# Patient Record
Sex: Male | Born: 1980 | Race: White | Hispanic: No | Marital: Single | Smoking: Current every day smoker
Health system: Southern US, Community
[De-identification: ages and names within clinical notes are randomized; demographics above are authoritative.]

---

## 2015-07-20 ENCOUNTER — Emergency Department (HOSPITAL_COMMUNITY): Payer: Self-pay

## 2015-07-20 ENCOUNTER — Encounter (HOSPITAL_COMMUNITY): Payer: Self-pay | Admitting: Emergency Medicine

## 2015-07-20 ENCOUNTER — Emergency Department (HOSPITAL_COMMUNITY)
Admission: EM | Admit: 2015-07-20 | Discharge: 2015-07-20 | Disposition: A | Discharge status: Home / Self Care | Payer: Self-pay | Attending: Emergency Medicine | Admitting: Emergency Medicine

## 2015-07-20 DIAGNOSIS — Z720 Tobacco use: Secondary | ICD-10-CM | POA: Insufficient documentation

## 2015-07-20 DIAGNOSIS — Y998 Other external cause status: Secondary | ICD-10-CM | POA: Insufficient documentation

## 2015-07-20 DIAGNOSIS — Y92410 Unspecified street and highway as the place of occurrence of the external cause: Secondary | ICD-10-CM | POA: Insufficient documentation

## 2015-07-20 DIAGNOSIS — S199XXA Unspecified injury of neck, initial encounter: Secondary | ICD-10-CM | POA: Insufficient documentation

## 2015-07-20 DIAGNOSIS — Y9355 Activity, bike riding: Secondary | ICD-10-CM | POA: Insufficient documentation

## 2015-07-20 DIAGNOSIS — S6991XA Unspecified injury of right wrist, hand and finger(s), initial encounter: Secondary | ICD-10-CM | POA: Insufficient documentation

## 2015-07-20 DIAGNOSIS — S4991XA Unspecified injury of right shoulder and upper arm, initial encounter: Secondary | ICD-10-CM | POA: Insufficient documentation

## 2015-07-20 DIAGNOSIS — Y9389 Activity, other specified: Secondary | ICD-10-CM | POA: Insufficient documentation

## 2015-07-20 DIAGNOSIS — S59901A Unspecified injury of right elbow, initial encounter: Secondary | ICD-10-CM | POA: Insufficient documentation

## 2015-07-20 MED ORDER — IBUPROFEN 600 MG PO TABS: 600.0000 mg | ORAL_TABLET | Freq: Four times a day (QID) | ORAL | Status: DC | PRN

## 2015-07-20 MED ORDER — OXYCODONE-ACETAMINOPHEN 5-325 MG PO TABS
1.0000 | ORAL_TABLET | Freq: Once | ORAL | Status: AC
  Administered 2015-07-20: 1 via ORAL
  Filled 2015-07-20: qty 1

## 2015-07-20 MED ORDER — CYCLOBENZAPRINE HCL 10 MG PO TABS: 5.0000 mg | ORAL_TABLET | Freq: Two times a day (BID) | ORAL | Status: AC | PRN
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
## 2015-07-20 NOTE — Discharge Instructions (Signed)
Bicycling, Adult Cyclists  Whether motivated by recreation or transportation, more adults are taking up cycling than ever before. Learning more about cycling greatly increases confidence. And it can be a great aid in learning to share the road more effectively.  If you are using your bicycle in different situations than you previously have, such as switching from occasional short recreational rides to regularly commuting to work, you may want to take a short workshop. Begin by assessing yourself: How confident are you in your cycling skills? What would you like to know more about? Are there particular kinds of cycling you would like to try out? Courses and workshops may focus on learning to race, long distance touring, teaching children to cycle safely, commuting, or bike repairs. With that in mind, adult cyclists may wish to check around their community for bike clubs, classes, rides, and other cycling opportunities. Check with the League of [employer] Bicyclists (www.bikeleague.org) for a listing of instructional opportunities available in your area.  Brush up on riding skills and rules if it has been a while since you cycled regularly.  Adult cyclists who wish to cycle with small children, and cyclists needing to transport cargo, should investigate the various child seats and trailers available. Determine which are the safest and which will work best for you.  Adult cyclists should learn more about off-road cycling, touring, and racing before participating in these activities. Adult cyclists are encouraged to try cycling on multi-use paths. Remember to respect others' needs on the trails.  Do not underestimate the importance of wearing a helmet. Accidents can happen anywhere. Cyclists should always wear a helmet.  Adult cyclists should learn how to handle harassment from motorists and others in traffic. It is in your best interest not to return any harassment or insults.  Just like a car, a bicycle  requires basic maintenance to keep running smoothly and safely. Bikes are easy to work on and you can save money by learning bike maintenance. This can be done by picking up a manual and taking a repair course. Those who really do not have time should keep their bicycles regularly serviced at a good bike shop.  Bicycling can fit into one's everyday life. Substitute a bike ride for a car trip. Adult cyclists should know the health and environmental benefits of bicycling. Document Released: 01/07/2004 Document Revised: 03/03/2014 Document Reviewed: 10/13/2008 [facility] Patient Information 2015 [place], [place]. This information is not intended to replace advice given to you by your health care provider. Make sure you discuss any questions you have with your health care provider.

## 2015-11-10 ENCOUNTER — Emergency Department (HOSPITAL_COMMUNITY): Payer: Self-pay

## 2015-11-10 ENCOUNTER — Encounter (HOSPITAL_COMMUNITY): Payer: Self-pay | Admitting: Emergency Medicine

## 2015-11-10 ENCOUNTER — Emergency Department (HOSPITAL_COMMUNITY)
Admission: EM | Admit: 2015-11-10 | Discharge: 2015-11-11 | Disposition: A | Discharge status: Home / Self Care | Payer: Self-pay | Attending: Emergency Medicine | Admitting: Emergency Medicine

## 2015-11-10 DIAGNOSIS — X58XXXD Exposure to other specified factors, subsequent encounter: Secondary | ICD-10-CM | POA: Insufficient documentation

## 2015-11-10 DIAGNOSIS — F17210 Nicotine dependence, cigarettes, uncomplicated: Secondary | ICD-10-CM | POA: Insufficient documentation

## 2015-11-10 DIAGNOSIS — L03113 Cellulitis of right upper limb: Secondary | ICD-10-CM | POA: Insufficient documentation

## 2015-11-10 DIAGNOSIS — S60551D Superficial foreign body of right hand, subsequent encounter: Secondary | ICD-10-CM | POA: Insufficient documentation

## 2015-11-10 MED ORDER — DOXYCYCLINE HYCLATE 100 MG PO TABS
100.0000 mg | ORAL_TABLET | Freq: Once | ORAL | Status: AC
  Administered 2015-11-10: 100 mg via ORAL
  Filled 2015-11-10: qty 1

## 2015-11-10 MED ORDER — IBUPROFEN 400 MG PO TABS
800.0000 mg | ORAL_TABLET | Freq: Once | ORAL | Status: AC
  Administered 2015-11-10: 800 mg via ORAL
  Filled 2015-11-10: qty 2

## 2015-11-10 MED ORDER — ACETAMINOPHEN 325 MG PO TABS
650.0000 mg | ORAL_TABLET | Freq: Once | ORAL | Status: AC
  Administered 2015-11-10: 650 mg via ORAL
  Filled 2015-11-10: qty 2

## 2015-11-10 MED ORDER — DIPHENHYDRAMINE HCL 25 MG PO CAPS
25.0000 mg | ORAL_CAPSULE | Freq: Once | ORAL | Status: AC
  Administered 2015-11-10: 25 mg via ORAL
  Filled 2015-11-10: qty 1

## 2015-11-10 NOTE — ED Provider Notes (Signed)
CSN: [id number]     Arrival date & time 11/10/15  2244 History  By signing my name below, I, Doreatha Martin, attest that this documentation has been prepared under the direction and in the presence of Cheri Fowler, PA-C. Electronically Signed: Doreatha Martin, ED Scribe. 11/10/2015. 11:03 PM.    Chief Complaint  Patient presents with  . Abscess    The history is provided by the patient. No language interpreter was used.    HPI Comments: Jared Collins is a 35 y.o. male who presents to the Emergency Department complaining of a moderate, gradually worsening painful area of redness and swelling to the base of the right pointer finger with clear drainange onset this morning. He states associated calor and numbness to the area. Pt notes the area started small this morning and has worsened and spread throughout the day. No known recent insect bites. He reports that pain is worsened with movement. Pt denies taking OTC medications at home to improve symptoms.  He denies fever, N/V, abdominal pain,                             paresthesia, weakness.    History reviewed. No pertinent past medical history. History reviewed. No pertinent past surgical history. No family history on file. Social History  Substance Use Topics  . Smoking status: Current Every Day Smoker -- 1.00 packs/day    Types: Cigarettes  . Smokeless tobacco: None  . Alcohol Use: No    Review of Systems A complete 10 system review of systems was obtained and all systems are negative except as noted in the HPI and PMH.    Allergies  Review of patient's allergies indicates no known allergies.  Home Medications   Prior to Admission medications   Medication Sig Start Date End Date Taking? Authorizing Provider  cyclobenzaprine (FLEXERIL) 10 MG tablet Take 0.5-1 tablets (5-10 mg total) by mouth 2 (two) times daily as needed. 07/20/15   Tiffany Neva Seat, PA-C  diphenhydrAMINE (BENADRYL) 25 mg capsule Take 1 capsule (25 mg total) by mouth every 6  (six) hours as needed. 11/11/15   Cheri Fowler, PA-C  doxycycline (VIBRAMYCIN) 100 MG capsule Take 1 capsule (100 mg total) by mouth 2 (two) times daily. 11/11/15   Cheri Fowler, PA-C  ibuprofen (ADVIL,MOTRIN) 800 MG tablet Take 1 tablet (800 mg total) by mouth 3 (three) times daily. 11/11/15   Boston Catarino, PA-C   BP 105/91 mmHg  Pulse 108  Temp(Src) 98.8 F (37.1 C) (Oral)  Resp 18  SpO2 100% Physical Exam  Constitutional: He is oriented to person, place, and time. He appears well-developed and well-nourished.  HENT:  Head: Normocephalic and atraumatic.  Eyes: Conjunctivae and EOM are normal. Pupils are equal, round, and reactive to light.  Neck: Normal range of motion. Neck supple.  Cardiovascular: Normal rate.   Capillary refill less than 3 seconds.   Pulmonary/Chest: Effort normal. No respiratory distress.  Abdominal: Soft. Bowel sounds are normal. He exhibits no distension.  Musculoskeletal: Normal range of motion.       Right hand: He exhibits swelling. He exhibits normal capillary refill.       Hands: Neurological: He is alert and oriented to person, place, and time.  Strength and sensation intact bilaterally throughout the right hand.   Skin: Skin is warm and dry. There is erythema.  Area of erythema, warmth, and moderate swelling on the dorsum of the right hand near the 2nd  and 3rd MCP joints, extending to the distal wrist. No induration or fluctuance.   Psychiatric: He has a normal mood and affect. His behavior is normal.  Nursing note and vitals reviewed.   ED Course  Procedures (including critical care time) DIAGNOSTIC STUDIES: Oxygen Saturation is 100% on RA, normal by my interpretation.    COORDINATION OF CARE: 10:56 PM Discussed treatment plan with pt at bedside which includes XR, antibiotics and benadryl and pt agreed to plan.  Imaging Review No results found for this or any previous visit.  I have personally reviewed and evaluated these images as part of my medical  decision-making.     MDM   Final diagnoses:  Cellulitis of right upper extremity  Foreign body in hand, right, subsequent encounter   Filed Vitals:   11/10/15 2253  BP: 105/91  Pulse: 108  Temp: 98.8 F (37.1 C)  Resp: 18    Meds given in ED:  Medications  diphenhydrAMINE (BENADRYL) capsule 25 mg (25 mg Oral Given 11/10/15 2303)  doxycycline (VIBRA-TABS) tablet 100 mg (100 mg Oral Given 11/10/15 2303)  ibuprofen (ADVIL,MOTRIN) tablet 800 mg (800 mg Oral Given 11/10/15 2353)  acetaminophen (TYLENOL) tablet 650 mg (650 mg Oral Given 11/10/15 2353)    New Prescriptions   DIPHENHYDRAMINE (BENADRYL) 25 MG CAPSULE    Take 1 capsule (25 mg total) by mouth every 6 (six) hours as needed.   DOXYCYCLINE (VIBRAMYCIN) 100 MG CAPSULE    Take 1 capsule (100 mg total) by mouth 2 (two) times daily.   IBUPROFEN (ADVIL,MOTRIN) 800 MG TABLET    Take 1 tablet (800 mg total) by mouth 3 (three) times daily.      Patient with painful area of redness, swelling, calor and clear drainage to the base of the right pointer finger. No known recent insect bites. Incision and drainage not indicated in the ED today. Benadryl, Doxycycline, and ice to affected area given in ED.  Doubt compartment syndrome.No fx or dislocation.  Joint spaces intact.  XR shows metallic soft tissue density seen in soft tissues dorsal to second middle phalanx. No open wound.  No indication for emergent removal.  Will refer to hand surgery.  Supportive care and return precautions discussed.  Pt sent home with Doxycyline and Benadryl. The patient appears reasonably screened and/or stabilized for discharge and I doubt any other emergent medical condition requiring further screening, evaluation, or treatment in the ED prior to discharge.     I personally performed the services described in this documentation, which was scribed in my presence. The recorded information has been reviewed and is accurate.    Cheri FowlerKayla Kaydince Towles, PA-C 11/11/15  0005  Mancel BaleElliott Wentz, MD 11/12/15 [phone]

## 2015-11-10 NOTE — ED Notes (Signed)
Pt arrives with pain and swelling to R hand, denies bite or injury. States starting today. Painful, numbness to hand.

## 2015-11-11 ENCOUNTER — Encounter (HOSPITAL_COMMUNITY): Payer: Self-pay | Admitting: Emergency Medicine

## 2015-11-11 ENCOUNTER — Emergency Department (HOSPITAL_COMMUNITY)
Admission: EM | Admit: 2015-11-11 | Discharge: 2015-11-11 | Disposition: A | Discharge status: Home / Self Care | Payer: Self-pay | Attending: Emergency Medicine | Admitting: Emergency Medicine

## 2015-11-11 DIAGNOSIS — L0291 Cutaneous abscess, unspecified: Secondary | ICD-10-CM

## 2015-11-11 DIAGNOSIS — L02511 Cutaneous abscess of right hand: Secondary | ICD-10-CM | POA: Insufficient documentation

## 2015-11-11 DIAGNOSIS — F17210 Nicotine dependence, cigarettes, uncomplicated: Secondary | ICD-10-CM | POA: Insufficient documentation

## 2015-11-11 LAB — CBC
HCT: 44.8 % (ref 39.0–52.0)
Hemoglobin: 15 g/dL (ref 13.0–17.0)
MCH: 29.2 pg (ref 26.0–34.0)
MCHC: 33.5 g/dL (ref 30.0–36.0)
MCV: 87.3 fL (ref 78.0–100.0)
PLATELETS: 287 10*3/uL (ref 150–400)
RBC: 5.13 MIL/uL (ref 4.22–5.81)
RDW: 13.3 % (ref 11.5–15.5)
WBC: 16.9 10*3/uL — AB (ref 4.0–10.5)

## 2015-11-11 LAB — COMPREHENSIVE METABOLIC PANEL
ALK PHOS: 62 U/L (ref 38–126)
ALT: 25 U/L (ref 17–63)
AST: 28 U/L (ref 15–41)
Albumin: 3.8 g/dL (ref 3.5–5.0)
Anion gap: 11 (ref 5–15)
BILIRUBIN TOTAL: 0.5 mg/dL (ref 0.3–1.2)
BUN: 13 mg/dL (ref 6–20)
CALCIUM: 9.7 mg/dL (ref 8.9–10.3)
CO2: 24 mmol/L (ref 22–32)
CREATININE: 0.91 mg/dL (ref 0.61–1.24)
Chloride: 104 mmol/L (ref 101–111)
Glucose, Bld: 114 mg/dL — ABNORMAL HIGH (ref 65–99)
Potassium: 4.3 mmol/L (ref 3.5–5.1)
Sodium: 139 mmol/L (ref 135–145)
Total Protein: 6.8 g/dL (ref 6.5–8.1)

## 2015-11-11 LAB — I-STAT CG4 LACTIC ACID, ED: Lactic Acid, Venous: 0.96 mmol/L (ref 0.5–2.0)

## 2015-11-11 MED ORDER — CEPHALEXIN 500 MG PO CAPS: 500.0000 mg | ORAL_CAPSULE | Freq: Four times a day (QID) | ORAL | Status: AC

## 2015-11-11 MED ORDER — DOXYCYCLINE HYCLATE 100 MG PO CAPS: 100.0000 mg | ORAL_CAPSULE | Freq: Two times a day (BID) | ORAL | Status: AC

## 2015-11-11 MED ORDER — LIDOCAINE HCL (PF) 1 % IJ SOLN
INTRAMUSCULAR | Status: AC
  Filled 2015-11-11: qty 10

## 2015-11-11 MED ORDER — HYDROCODONE-ACETAMINOPHEN 5-325 MG PO TABS
1.0000 | ORAL_TABLET | Freq: Once | ORAL | Status: AC
  Administered 2015-11-11: 1 via ORAL
  Filled 2015-11-11: qty 1

## 2015-11-11 MED ORDER — HYDROCODONE-ACETAMINOPHEN 5-325 MG PO TABS: 2.0000 | ORAL_TABLET | ORAL | Status: AC | PRN

## 2015-11-11 MED ORDER — DIPHENHYDRAMINE HCL 25 MG PO CAPS: 25.0000 mg | ORAL_CAPSULE | Freq: Four times a day (QID) | ORAL | Status: AC | PRN

## 2015-11-11 MED ORDER — IBUPROFEN 800 MG PO TABS: 800.0000 mg | ORAL_TABLET | Freq: Three times a day (TID) | ORAL | Status: AC

## 2015-11-11 NOTE — Progress Notes (Signed)
Orthopedic Tech Progress Note Patient Details:  Jared Collins [birth year] [id number]  Ortho Devices Type of Ortho Device: Ace wrap, Rad Gutter splint Ortho Device/Splint Location: RUE Ortho Device/Splint Interventions: Ordered, Application   [name], [name] [name] 11/11/2015, 7:23 PM

## 2015-11-11 NOTE — ED Notes (Signed)
Patient left at this time with all belongings. 

## 2015-11-11 NOTE — ED Provider Notes (Signed)
CSN: [id number]     Arrival date & time 11/11/15  1627 History  By signing my name below, I, Ronney Lion, attest that this documentation has been prepared under the direction and in the presence of [employer], PA-C. Electronically Signed: Ronney Lion, ED Scribe. 11/11/2015. 11:10 PM.    Chief Complaint  Patient presents with  . Cellulitis   The history is provided by the patient. No language interpreter was used.    HPI Comments: Jared Collins is a 35 y.o. male with no pertinent PMHx, who presents to the Emergency Department complaining of gradual-onset, constant, worsening, area of pain, redness, and swelling to the base of his right index finger that began yesterday. He reports associated calor and numbness to the area. He states the area originally appeared to start out as an abscess but worsened and started to spread, with the area of swelling moving up his arm. Patient was seen at [facility] ED here yesterday and prescribed doxycycline; however, patient states he has been unable to afford it. However, he had taken one dosage of doxycycline as administered here yesterday. Patient had followed up at the surgeon's office today and was instructed to come to the ED. Movement exacerbates her pain. He states he did not try anything at home to alleviate his symptoms. He denies any known recent insect bites.    History reviewed. No pertinent past medical history. History reviewed. No pertinent past surgical history. No family history on file. Social History  Substance Use Topics  . Smoking status: Current Every Day Smoker -- 1.00 packs/day    Types: Cigarettes  . Smokeless tobacco: None  . Alcohol Use: No    Review of Systems A complete 10 system review of systems was obtained and all systems are negative except as noted in the HPI and PMH.    Allergies  Review of patient's allergies indicates no known allergies.  Home Medications   Prior to Admission medications   Medication Sig Start  Date End Date Taking? Authorizing Provider  cephALEXin (KEFLEX) 500 MG capsule Take 1 capsule (500 mg total) by mouth 4 (four) times daily. 11/11/15   Eyvonne Mechanic, PA-C  cyclobenzaprine (FLEXERIL) 10 MG tablet Take 0.5-1 tablets (5-10 mg total) by mouth 2 (two) times daily as needed. 07/20/15   Tiffany Neva Seat, PA-C  diphenhydrAMINE (BENADRYL) 25 mg capsule Take 1 capsule (25 mg total) by mouth every 6 (six) hours as needed. 11/11/15   Cheri Fowler, PA-C  doxycycline (VIBRAMYCIN) 100 MG capsule Take 1 capsule (100 mg total) by mouth 2 (two) times daily. 11/11/15   Cheri Fowler, PA-C  HYDROcodone-acetaminophen (NORCO/VICODIN) 5-325 MG tablet Take 2 tablets by mouth every 4 (four) hours as needed. 11/11/15   Eyvonne Mechanic, PA-C  ibuprofen (ADVIL,MOTRIN) 800 MG tablet Take 1 tablet (800 mg total) by mouth 3 (three) times daily. 11/11/15   Kayla Rose, PA-C   BP 113/86 mmHg  Pulse 99  Temp(Src) 97.4 F (36.3 C) (Oral)  Resp 18  Ht 5\' 9"  (1.753 m)  Wt 77.111 kg  BMI 25.09 kg/m2  SpO2 100%   Physical Exam  Constitutional: He is oriented to person, place, and time. He appears well-developed and well-nourished. No distress.  HENT:  Head: Normocephalic and atraumatic.  Eyes: Conjunctivae and EOM are normal.  Neck: Neck supple. No tracheal deviation present.  Cardiovascular: Normal rate.   Pulmonary/Chest: Effort normal. No respiratory distress.  Musculoskeletal: Normal range of motion.  Capillary refill <2 seconds. Strength and sensation intact bilaterally  in BUE.  Neurological: He is alert and oriented to person, place, and time.  Skin: Skin is warm and dry.  Area of erythema, warmth, and moderate swelling on the dorsum of the right hand near the 2nd and 3rd MCP joints, extending to the distal wrist. No induration or fluctuance.   Psychiatric: He has a normal mood and affect. His behavior is normal.  Nursing note and vitals reviewed.   ED Course  Procedures (including critical care  time)  DIAGNOSTIC STUDIES: Oxygen Saturation is 100% on RA, normal by my interpretation.    COORDINATION OF CARE: 5:37 PM - Discussed treatment plan with pt at bedside which includes consult with hand specialist on call, Dr. Melvyn Novasrtmann. Pt verbalized understanding and agreed to plan.   Labs Review Labs Reviewed  COMPREHENSIVE METABOLIC PANEL - Abnormal; Notable for the following:    Glucose, Bld 114 (*)    All other components within normal limits  CBC - Abnormal; Notable for the following:    WBC 16.9 (*)    All other components within normal limits  WOUND CULTURE  I-STAT CG4 LACTIC ACID, ED    Imaging Review Dg Hand Complete Right  11/10/2015  CLINICAL DATA:  Right hand pain and swelling without known injury. EXAM: RIGHT HAND - COMPLETE 3+ VIEW COMPARISON:  July 20, 2015. FINDINGS: There is no evidence of acute fracture or dislocation. Joint spaces are intact. Metallic soft tissue density is seen in soft tissues dorsal to second middle phalanx. IMPRESSION: No fracture or dislocation is noted. Metallic foreign body is noted in soft tissues dorsal to second middle phalanx. Electronically Signed   By: Lupita RaiderJames  Green Jr, M.D.   On: 11/10/2015 23:41   I have personally reviewed and evaluated these images and lab results as part of my medical decision-making.  MDM   Final diagnoses:  Abscess   Labs: CBC, CMET  Imaging:  Consults:  Therapeutics:  Discharge Meds:   Assessment/Plan:Patient presents with abscess to the finger. Dr. Orlan Leavensrtman was consult and who performed a digital block and I&D here in the ED. Patient will be discharged home with antibiotics, pain medication, follow-up with Dr. Orlan Leavensrtman in his office. Patient is given strict return cautions, verbalized understanding and agreement for today's plan had no further questions or concerns at the time of discharge.    I personally performed the services described in this documentation, which was scribed in my presence. The  recorded information has been reviewed and is accurate.     Eyvonne MechanicJeffrey Joyanne Eddinger, PA-C 11/11/15 2311  Mancel BaleElliott Wentz, MD 11/12/15 [phone]

## 2015-11-11 NOTE — Discharge Instructions (Signed)

## 2015-11-11 NOTE — Discharge Instructions (Signed)
Abscess An abscess is an infected area that contains a collection of pus and debris.It can occur in almost any part of the body. An abscess is also known as a furuncle or boil. CAUSES  An abscess occurs when tissue gets infected. This can occur from blockage of oil or sweat glands, infection of hair follicles, or a minor injury to the skin. As the body tries to fight the infection, pus collects in the area and creates pressure under the skin. This pressure causes pain. People with weakened immune systems have difficulty fighting infections and get certain abscesses more often.  SYMPTOMS Usually an abscess develops on the skin and becomes a painful mass that is red, warm, and tender. If the abscess forms under the skin, you may feel a moveable soft area under the skin. Some abscesses break open (rupture) on their own, but most will continue to get worse without care. The infection can spread deeper into the body and eventually into the bloodstream, causing you to feel ill.  DIAGNOSIS  Your caregiver will take your medical history and perform a physical exam. A sample of fluid may also be taken from the abscess to determine what is causing your infection. TREATMENT  Your caregiver may prescribe antibiotic medicines to fight the infection. However, taking antibiotics alone usually does not cure an abscess. Your caregiver may need to make a small cut (incision) in the abscess to drain the pus. In some cases, gauze is packed into the abscess to reduce pain and to continue draining the area. HOME CARE INSTRUCTIONS   Only take over-the-counter or prescription medicines for pain, discomfort, or fever as directed by your caregiver.  If you were prescribed antibiotics, take them as directed. Finish them even if you start to feel better.  If gauze is used, follow your caregiver's directions for changing the gauze.  To avoid spreading the infection:  Keep your draining abscess covered with a  bandage.  Wash your hands well.  Do not share personal care items, towels, or whirlpools with others.  Avoid skin contact with others.  Keep your skin and clothes clean around the abscess.  Keep all follow-up appointments as directed by your caregiver. SEEK MEDICAL CARE IF:   You have increased pain, swelling, redness, fluid drainage, or bleeding.  You have muscle aches, chills, or a general ill feeling.  You have a fever. MAKE SURE YOU:   Understand these instructions.  Will watch your condition.  Will get help right away if you are not doing well or get worse.   This information is not intended to replace advice given to you by your health care provider. Make sure you discuss any questions you have with your health care provider.   Document Released: 07/27/2005 Document Revised: 04/17/2012 Document Reviewed: 12/30/2011 Elsevier Interactive Patient Education 2016 [employer]   Please follow-up with Dr. Orlan Leavensrtman on Friday for reevaluation. Please return immediately if any new or worsening signs or symptoms present.

## 2015-11-11 NOTE — Consult Note (Signed)
NAMCoralee Collins:  Yielding, Wasil                ACCOUNT NO.:  [id number]  MEDICAL RECORD NO.:  [id number]  LOCATION:  TR08C                        FACILITY:  [facility]  PHYSICIAN:  Sharma CovertFred W. Jahnya Trindade IV, M.D.DATE OF BIRTH:  10/14/1981  DATE OF CONSULTATION:  11/11/2015 DATE OF DISCHARGE:  11/11/2015                                CONSULTATION   REQUESTING PHYSICIAN:  Emergency Department.  REASON FOR CONSULTATION:  Mr. [name] is a right-hand-dominant gentleman with a worsening infection of the dorsal aspect of the right index finger.  The patient was seen and evaluated last time in the emergency room, returned back today with a worsening infection.  The patient is concerned about the redness and swelling.  No prior injury to the finger.  Past medical history, past surgical history, medications, allergies were reviewed and dictated in the chart.  PHYSICAL EXAMINATION:  GENERAL:  He is a healthy-appearing male. VITAL SIGNS:  Height and weight listed in the computer. NEURO:  Good hand coordination in his left hand.  Normal mood.  He is alert and oriented to person, place, and time. EXTREMITIES:  On examination of the right hand, the patient does have the dorsal abscess over the index finger proximal phalanx.  He has some mild redness and swelling.  Approximately, he is able to make the okay sign cross fingers, extend his thumb, extend his digits.  His fingertips are warm and well perfused.  No ascending erythema or lymphangitis.  The motor and sensory function is normal to the hand.  PROCEDURE NOTE:  After verbal consent was obtained from the patient, we elected to proceed with incision and drainage of the right index finger abscess.  1% Xylocaine was used to block the finger.  The patient tolerated it well.  He was prepped and draped in normal sterile fashion. Time-out was called, correct side was identified, and procedure begun. Attention then turned to the index finger.  A long incision  made directly over the abscess.  A 15 blade was then used __________ down to the extensor mechanism.  After this was carried out, gross purulence was encountered.  This was then bluntly spread opening up the abscess area. Thorough wound irrigation done.  Following this, quarter-inch packing gauze then used to pack this area.  The wound was irrigated and packed open.  Sterile compressive bandage then applied.  The patient tolerated the procedure well.  The patient is going to be placed in a radial gutter splint.  He tolerated application of splint.  Oral antibiotics and pain medicines administered by the emergency department.  Plan to see him back in approximately 2 days for wound check, packing removal, and then begin some wet-to-dry dressing changes.  Follow him closely.  All questions were answered __________ the patient tolerated the procedure.     Madelynn DoneFred W. Mariabelen Pressly IV, M.D.     FWO/MEDQ  D:  11/11/2015  T:  11/11/2015  Job:  [id number]

## 2015-11-11 NOTE — ED Notes (Signed)
Pt has redness and swelling to right index finger. Was seen here yesterday and followed up with surgeons office today and was told to come to ED.

## 2015-11-14 ENCOUNTER — Emergency Department (HOSPITAL_COMMUNITY)
Admission: EM | Admit: 2015-11-14 | Discharge: 2015-11-14 | Disposition: A | Discharge status: Home / Self Care | Payer: Self-pay | Attending: Emergency Medicine | Admitting: Emergency Medicine

## 2015-11-14 ENCOUNTER — Encounter (HOSPITAL_COMMUNITY): Payer: Self-pay | Admitting: Emergency Medicine

## 2015-11-14 DIAGNOSIS — Z4801 Encounter for change or removal of surgical wound dressing: Secondary | ICD-10-CM | POA: Insufficient documentation

## 2015-11-14 DIAGNOSIS — F17210 Nicotine dependence, cigarettes, uncomplicated: Secondary | ICD-10-CM | POA: Insufficient documentation

## 2015-11-14 DIAGNOSIS — R232 Flushing: Secondary | ICD-10-CM | POA: Insufficient documentation

## 2015-11-14 DIAGNOSIS — M79644 Pain in right finger(s): Secondary | ICD-10-CM | POA: Insufficient documentation

## 2015-11-14 DIAGNOSIS — Z5189 Encounter for other specified aftercare: Secondary | ICD-10-CM

## 2015-11-14 DIAGNOSIS — R509 Fever, unspecified: Secondary | ICD-10-CM | POA: Insufficient documentation

## 2015-11-14 DIAGNOSIS — Z791 Long term (current) use of non-steroidal anti-inflammatories (NSAID): Secondary | ICD-10-CM | POA: Insufficient documentation

## 2015-11-14 DIAGNOSIS — Z792 Long term (current) use of antibiotics: Secondary | ICD-10-CM | POA: Insufficient documentation

## 2015-11-14 DIAGNOSIS — R112 Nausea with vomiting, unspecified: Secondary | ICD-10-CM | POA: Insufficient documentation

## 2015-11-14 LAB — WOUND CULTURE: SPECIAL REQUESTS: NORMAL

## 2015-11-14 MED ORDER — OXYCODONE-ACETAMINOPHEN 5-325 MG PO TABS: 1.0000 | ORAL_TABLET | ORAL | Status: AC | PRN

## 2015-11-14 MED ORDER — OXYCODONE-ACETAMINOPHEN 5-325 MG PO TABS
1.0000 | ORAL_TABLET | Freq: Once | ORAL | Status: AC
  Administered 2015-11-14: 1 via ORAL
  Filled 2015-11-14: qty 1

## 2015-11-14 MED ORDER — TRAMADOL HCL 50 MG PO TABS
50.0000 mg | ORAL_TABLET | Freq: Once | ORAL | Status: AC
  Administered 2015-11-14: 50 mg via ORAL
  Filled 2015-11-14: qty 1

## 2015-11-14 NOTE — ED Provider Notes (Signed)
CSN: [id number]     Arrival date & time 11/14/15  1619 History   First MD Initiated Contact with Patient 11/14/15 1708     Chief Complaint  Patient presents with  . Post-op Problem  . Fever    HPI  Jared Collins is a 35 y.o. male with a PMH of right index finger abscess s/p I&D 11/11/15 who presents to the ED with persistent pain. He also reports hot flashes, nausea, and vomiting, which he attributes to taking vicodin. He notes subjective fever because he has been "sweating so much," though states he has not taken his temperature at home. He was seen in the ED 11/11/15, at which time he had an abscess to his right index finger drained by Dr. Melvyn Novas. He was discharged with antibiotics, and instructed to follow-up for removal of the packing yesterday, which he did not do secondary to lack of transportation. He reports movement exacerbates his pain. He denies alleviating factors.   History reviewed. No pertinent past medical history. No past surgical history on file. No family history on file. Social History  Substance Use Topics  . Smoking status: Current Every Day Smoker -- 1.00 packs/day    Types: Cigarettes  . Smokeless tobacco: None  . Alcohol Use: No     Review of Systems  Constitutional: Positive for fever. Negative for chills.  Gastrointestinal: Positive for nausea and vomiting.  Skin: Positive for wound.  All other systems reviewed and are negative.     Allergies  Vicodin  Home Medications   Prior to Admission medications   Medication Sig Start Date End Date Taking? Authorizing Provider  cephALEXin (KEFLEX) 500 MG capsule Take 1 capsule (500 mg total) by mouth 4 (four) times daily. 11/11/15  Yes Jeffrey Hedges, PA-C  diphenhydrAMINE (BENADRYL) 25 mg capsule Take 1 capsule (25 mg total) by mouth every 6 (six) hours as needed. Patient taking differently: Take 25 mg by mouth every 6 (six) hours as needed for allergies.  11/11/15  Yes Cheri Fowler, PA-C   HYDROcodone-acetaminophen (NORCO/VICODIN) 5-325 MG tablet Take 2 tablets by mouth every 4 (four) hours as needed. 11/11/15  Yes Jeffrey Hedges, PA-C  ibuprofen (ADVIL,MOTRIN) 800 MG tablet Take 1 tablet (800 mg total) by mouth 3 (three) times daily. 11/11/15  Yes Kayla Rose, PA-C  cyclobenzaprine (FLEXERIL) 10 MG tablet Take 0.5-1 tablets (5-10 mg total) by mouth 2 (two) times daily as needed. Patient not taking: Reported on 11/14/2015 07/20/15   Marlon Pel, PA-C  doxycycline (VIBRAMYCIN) 100 MG capsule Take 1 capsule (100 mg total) by mouth 2 (two) times daily. 11/11/15   Cheri Fowler, PA-C  oxyCODONE-acetaminophen (PERCOCET/ROXICET) 5-325 MG tablet Take 1 tablet by mouth every 4 (four) hours as needed for severe pain. 11/14/15   Mady Gemma, PA-C    BP 116/64 mmHg  Pulse 81  Temp(Src) 98.1 F (36.7 C) (Oral)  Resp 14  SpO2 98% Physical Exam  Constitutional: He is oriented to person, place, and time. He appears well-developed and well-nourished. No distress.  HENT:  Head: Normocephalic and atraumatic.  Right Ear: External ear normal.  Left Ear: External ear normal.  Nose: Nose normal.  Mouth/Throat: Uvula is midline, oropharynx is clear and moist and mucous membranes are normal.  Eyes: Conjunctivae, EOM and lids are normal. Pupils are equal, round, and reactive to light. Right eye exhibits no discharge. Left eye exhibits no discharge. No scleral icterus.  Neck: Normal range of motion. Neck supple.  Cardiovascular: Normal rate, regular rhythm, normal  heart sounds, intact distal pulses and normal pulses.   Pulmonary/Chest: Effort normal and breath sounds normal. No respiratory distress. He has no wheezes. He has no rales.  Abdominal: Soft. Normal appearance and bowel sounds are normal. He exhibits no distension and no mass. There is no tenderness. There is no rigidity, no rebound and no guarding.  Musculoskeletal: He exhibits tenderness. He exhibits no edema.  TTP of right index  finger with decreased ROM due to pain. Strength and sensation intact. Distal pulses intact. Cap refill <3 seconds.  Neurological: He is alert and oriented to person, place, and time. He has normal strength. No cranial nerve deficit or sensory deficit.  Skin: Skin is warm, dry and intact. No rash noted. He is not diaphoretic. There is erythema. No pallor.  Incision to dorsal aspect of right index finger appears intact. Mild surrounding erythema extending to dorsal aspect of right 2nd MCP. No fluctuance. No streaking.   Psychiatric: He has a normal mood and affect. His speech is normal and behavior is normal.  Nursing note and vitals reviewed.   ED Course  Procedures (including critical care time)  Labs Review Labs Reviewed - No data to display  Imaging Review No results found.     EKG Interpretation None      MDM   Final diagnoses:  Wound check, abscess  Non-intractable vomiting with nausea, vomiting of unspecified type    35 year old male presents with persistent pain to his right index finger s/p incision and drainage of right index finger abscess 1/11. Was instructed to follow-up with hand surgery yesterday for packing removal, however failed to do so. He states he has not been tolerating vicodin well, and that he has had nausea and vomiting with this medication.  Patient is afebrile. HR 105. Dressing stuck to right index finger. Soaked dressing in normal saline, dressing and packing subsequently fell off. Incision appears to be draining serous fluid, no purulence. Patient has erythema to the dorsal aspect of his right index finger extending to his right 2nd MCP, however he states the redness and swelling seems to be improving, though he has continued pain.   Given tramadol. After receiving tramadol, patient states he remembers that this medication gives him a headache, but that he has tolerated percocet in the past. Will give percocet.   Wet to dry dressing applied, patient  placed in splint. HR 80s. Patient is non-toxic and well-appearing, feel he is stable for discharge at this time. Wound seems to be healing appropriately, no signs of worsening infection (patient states redness and swelling is improved from prior). Patient to follow-up with Dr. Melvyn Novasrtmann in 2 days for wound recheck. Instructed to continue antibiotics, will give short course of percocet for pain management. Strict return precautions discussed. Patient verbalizes his understanding and is in agreement with plan.  BP 116/64 mmHg  Pulse 81  Temp(Src) 98.1 F (36.7 C) (Oral)  Resp 14  SpO2 98%     Mady Gemmalizabeth C Westfall, PA-C 11/14/15 2235  Rolan BuccoMelanie Belfi, MD 11/14/15 2258

## 2015-11-14 NOTE — ED Notes (Signed)
Pt states that he was seen at [facility] and "had surgery on his rt arm".  States he thinks he is allergic to vicodin because he "hasn't been feeling right".  When asked to elaborate, states that he has been feeling nauseated and has been "zoning out".  Pt closes eyes while writer is trying to ask him questions and does not answer until called by name.  States he has vomited 8 or 9 times in the last 24 hrs.  States he has a fever but has not checked it at home.  States he "was sweating so he must have had a fever".

## 2015-11-14 NOTE — Discharge Instructions (Signed)
1. Medications: percocet, antibiotic, usual home medications 2. Treatment: rest, drink plenty of fluids, change dressing and keep clean and dry 3. Follow Up: please followup with hand surgery in 2 days for wound recheck and for discussion of your diagnoses and further evaluation after today's visit; if you do not have a primary care doctor use the resource guide provided to find one; please return to the ER for increased pain, redness, swelling, new or worsening symptoms   Wound Care Taking care of your wound properly can help to prevent pain and infection. It can also help your wound to heal more quickly.  HOW TO CARE FOR YOUR WOUND  Take or apply over-the-counter and prescription medicines only as told by your health care provider.  If you were prescribed antibiotic medicine, take or apply it as told by your health care provider. Do not stop using the antibiotic even if your condition improves.  Clean the wound each day or as told by your health care provider.  Wash the wound with mild soap and water.  Rinse the wound with water to remove all soap.  Pat the wound dry with a clean towel. Do not rub it.  There are many different ways to close and cover a wound. For example, a wound can be covered with stitches (sutures), skin glue, or adhesive strips. Follow instructions from your health care provider about:  How to take care of your wound.  When and how you should change your bandage (dressing).  When you should remove your dressing.  Removing whatever was used to close your wound.  Check your wound every day for signs of infection. Watch for:  Redness, swelling, or pain.  Fluid, blood, or pus.  Keep the dressing dry until your health care provider says it can be removed. Do not take baths, swim, use a hot tub, or do anything that would put your wound underwater until your health care provider approves.  Raise (elevate) the injured area above the level of your heart while you  are sitting or lying down.  Do not scratch or pick at the wound.  Keep all follow-up visits as told by your health care provider. This is important. SEEK MEDICAL CARE IF:  You received a tetanus shot and you have swelling, severe pain, redness, or bleeding at the injection site.  You have a fever.  Your pain is not controlled with medicine.  You have increased redness, swelling, or pain at the site of your wound.  You have fluid, blood, or pus coming from your wound.  You notice a bad smell coming from your wound or your dressing. SEEK IMMEDIATE MEDICAL CARE IF:  You have a red streak going away from your wound.   This information is not intended to replace advice given to you by your health care provider. Make sure you discuss any questions you have with your health care provider.   Document Released: 07/26/2008 Document Revised: 03/03/2015 Document Reviewed: 10/13/2014 Elsevier Interactive Patient Education 2016 [employer].   Emergency Department Resource Guide 1) Find a Doctor and Pay Out of Pocket Although you won't have to find out who is covered by your insurance plan, it is a good idea to ask around and get recommendations. You will then need to call the office and see if the doctor you have chosen will accept you as a new patient and what types of options they offer for patients who are self-pay. Some doctors offer discounts or will set up payment  plans for their patients who do not have insurance, but you will need to ask so you aren't surprised when you get to your appointment.  2) Contact Your Local Health Department Not all health departments have doctors that can see patients for sick visits, but many do, so it is worth a call to see if yours does. If you don't know where your local health department is, you can check in your phone book. The CDC also has a tool to help you locate your state's health department, and many state websites also have listings of all of  their local health departments.  3) Find a Walk-in Clinic If your illness is not likely to be very severe or complicated, you may want to try a walk in clinic. These are popping up all over the country in pharmacies, drugstores, and shopping centers. They're usually staffed by nurse practitioners or physician assistants that have been trained to treat common illnesses and complaints. They're usually fairly quick and inexpensive. However, if you have serious medical issues or chronic medical problems, these are probably not your best option.  No Primary Care Doctor: - Call Health Connect at  [phone] - they can help you locate a primary care doctor that  accepts your insurance, provides certain services, etc. - Physician Referral Service- [phone]  Chronic Pain Problems: Organization         Address  Phone   Notes  [facility]  [phone] Patients need to be referred by their primary care doctor.   Medication Assistance: Organization         Address  Phone   Notes  [facility] Medication [facility] [address] [place], [place] [address] [phone] --Must be a resident of [facility] -- Must have NO insurance coverage whatsoever (no Medicaid/ Medicare, etc.) -- The pt. MUST have a primary care doctor that directs their care regularly and follows them in the community   MedAssist  [phone]   [employer]  [phone]    Agencies that provide inexpensive medical care: Organization         Address  Phone   Notes  [facility]  [phone]   [facility]    [phone]   [facility] [address] [place], [place] [address] [phone]   [facility] [address] [place], [place] [phone]   Planned Parenthood    [phone]   [facility]    [phone]   Community Health and [facility]  [address],  [place] Phone:  [phone], Fax:  [phone] Hours of Operation:  9 am - 6 pm, M-F.  Also accepts Medicaid/Medicare and self-pay.  [facility]  [address], [place] Phone: [phone], Fax: [phone]. Hours of Operation:  8:30 am - 5:30 pm, M-F.  Also accepts Medicaid and self-pay.  [facility] [place] [address] [place] Phone: [phone]   [facility] [address] Natasha Bence [place], [place] [phone], Ext. [phone] Mondays & Thursdays: 7-9 AM.  First 15 patients are seen on a first come, first serve basis.    Medicaid-accepting [facility] Providers:  Organization         Address  Phone   Notes  [facility] [address], [place] [phone] Also accepts self-pay  patients.  [facility] [address] Laurell Josephs [place], [place]  [phone]   [facility] [address], [place] [phone]   [facility] [address] [place] [phone]   Renaye Rakers [address], [place]   [phone] Only accepts [place] Access [place] patients after they have their name applied to their card.   Self-Pay (no insurance) in [facility]:  Organization         Address  Phone   Notes  Sickle Cell Patients, [facility] [address] [place], [place] [phone]   [facility] [address] [place], [place] [phone]   [facility] [place]  [address], [place] [phone]   [facility]/Dr. Osei-Bonsu  [address], [place] or [address], [place] [phone] Phone number for both [place] and [place] locations is the same.  Urgent Medical and [facility] [address] [place] [phone]   [facility] [address] [place] or [address] [phone]   [facility] [address], [place] [phone], phone; [phone], fax Sees patients 1st and 3rd Saturday of every month.  Must not qualify for public or private insurance (i.e. Medicaid, Medicare, [place] Health Choice, Veterans' Benefits)  Household income should be no more than 200% of the poverty level The clinic cannot treat you if you are pregnant or think you are pregnant  Sexually transmitted diseases are not treated at the clinic.    Dental Care: Organization         Address  Phone  Notes  [facility] [address] [place], [place] [phone] Accepts children up to age 64 who are enrolled in [place] or [place] Health Choice; pregnant women with a Medicaid card; and children who have applied for Medicaid or [place] Health Choice, but were declined, whose parents can pay a reduced fee at time of service.  [facility] Department of [facility]  [address], [place] [phone] Accepts children up to age 52 who are enrolled in [place] or [place] Health Choice; pregnant women with a Medicaid card; and children who have applied for Medicaid or [place] Health Choice, but were declined, whose parents can pay a reduced fee at time of service.  [facility]  [address] [place], [place] [phone] Patients are seen by appointment only. Walk-ins are not accepted. [facility] will see patients 43 years of age and older. Monday - Tuesday (8am-5pm) Most Wednesdays (8:30-5pm) $30 per visit, cash only  [facility]  [address], [facility] [phone] Patients are seen by appointment only. Walk-ins are not accepted. [facility] will see patients 73 years of age and older. One Wednesday Evening (Monthly: Volunteer Based).  $30 per visit, cash only  [employer]   [phone] for adults; Children under age 61, call Graduate Pediatric Dentistry at [phone]. Children aged 108-14, please call [phone] to request a pediatric application.  Dental services are provided in all areas of dental care including fillings, crowns and bridges, complete and partial dentures, implants, gum treatment, root canals, and extractions. Preventive care is also provided. Treatment is provided to  both adults and children. Patients are selected via a lottery and there is often a waiting list.   [facility] [address] [place]  [phone] [email or web address]   [facility] [address] [place], [place] [phone], Ext. [phone] Second and Fourth Thursday of each month, opens at 6:30 AM; Clinic ends at 9 AM.  Patients are seen on a first-come first-served basis, and a limited number are seen during each clinic.   [facility]  [address] Ether Griffins [place], [place] [phone]   Eligibility Requirements You must have lived in [place], [place], or [place] counties for at least the last three months.   You cannot be eligible for state or federal sponsored [place], including [employer], [place], or [employer].   You generally cannot be eligible for healthcare insurance through your employer.    How to apply: Eligibility screenings are held every Tuesday and Wednesday afternoon from 1:00 pm until 4:00 pm. You do not need an appointment for the interview!  [facility] [address], [place], [place] [phone]   [facility] Health Department  [phone]   [facility] Health Department  [phone]   [facility] Health Department  [phone]    Behavioral Health Resources in the Community: Intensive Outpatient Programs Organization         Address  Phone  Notes  [facility] [address] [place], [place] [phone]   [facility] Outpatient [address], [place], [place] [phone]   [facility] [address] [place], [place]  [phone]   [facility] Mental Health [address]  [place], [place] [phone] or [phone]   Substance Abuse Resources Organization         Address  Phone  Notes  [facility]  [phone]   [facility]  [phone]   [employer]  [phone]   Floydene Flock  [phone]   Residential & Outpatient Substance Abuse Program  [phone]   Psychological Services Organization         Address  Phone  Notes  [facility] Behavioral Health  [phone]   [facility]  [phone]   [facility] Mental Health [address], [place] [phone] or [phone]    Mobile Crisis Teams Organization         Address  Phone  Notes  [facility], Mobile Crisis Care Unit  [phone]   [facility]  [address] [place], [place] [phone]   Doristine Locks [address] [place] [phone]    Self-Help/Support Groups Organization         Address  Phone             Notes  [facility] of [place] - variety of support groups  [phone]- I7437963 Call for more information  Narcotics Anonymous (NA), [facility] [address], [employer] [place]  2 meetings at this location   Statistician         Address  Phone  Notes  [facility] [address],    [place]  [phone]   [facility]  [address], [place] [address], [place], [place] [phone]   [facility] [address] [place], [place] [phone] Admissions: 8am-3pm M-F  [facility] [address],    [place], [place] [phone]  [facility] [address], [place], [place] [phone]   [facility] [address]  [place], [place] [phone]     [facility] - Intensive Outpatient [address], [place], [place] [phone]   [facility] ([facility] [address]  [place], [place] [phone] or [phone]   [facility] ([facility]) [address] [place], [place] [phone] Accepts Medicaid  [facility] [address]  [place] [phone] Substance Abuse/Addiction Treatment   [facility] Behavioral Health Resources Organization         Address  Phone  Notes  [facility]  [phone]) [phone]   Angie FavaJulie Brannon, PhD [address] [place], [place]   [phone] or [phone]   [facility]   [address] [place], [place] [phone]) [phone]   [facility] [address], [place], [place] [phone] Insurance/Medicaid/sponsorship through [facility]  [facility] [address]                                    [place], [place] [phone] Therapy/tele-psych/case  [facility] [address]   [place], [place] [phone]    Dr. Lolly MustacheArfeen  [phone]   [facility] [place] [facility]  [employer] [facility] 1) [address] [place] [address], [place] 2) [address], [place] 3)  [address], [place] [phone]) [phone]  [phone](336) [phone]   [facility] Child Abuse Hotline [phone]) [phone] or [phone](336) [phone] (After Hours)

## 2015-11-15 ENCOUNTER — Telehealth (HOSPITAL_COMMUNITY): Payer: Self-pay

## 2015-11-15 NOTE — Progress Notes (Signed)
ED Antimicrobial Stewardship Positive Culture Follow Up   Jared Collins is an 35 y.o. male who [facility] with a chief complaint of  Chief Complaint  Patient presents with  . Post-op Problem  . Fever    Recent Results (from the past 720 hour(s))  Wound culture     Status: None   Collection Time: 11/11/15  6:46 PM  Result Value Ref Range Status   Specimen Description WOUND RIGHT FINGER  Final   Special Requests Normal  Final   Gram Stain   Final    MODERATE WBC PRESENT, PREDOMINANTLY PMN NO SQUAMOUS EPITHELIAL CELLS SEEN MODERATE GRAM POSITIVE COCCI IN PAIRS IN CLUSTERS Performed at [facility]    Culture   Final    ABUNDANT METHICILLIN RESISTANT STAPHYLOCOCCUS AUREUS Note: RIFAMPIN AND GENTAMICIN SHOULD NOT BE USED AS SINGLE DRUGS FOR TREATMENT OF STAPH INFECTIONS. This organism DOES NOT demonstrate inducible Clindamycin resistance in vitro. CRITICAL RESULT CALLED TO, READ BACK BY AND VERIFIED WITH: TONI F 11/14/15  @1031AM  BY REAMM Performed at [facility]    Report Status 11/14/2015 FINAL  Final   Organism ID, Bacteria METHICILLIN RESISTANT STAPHYLOCOCCUS AUREUS  Final      Susceptibility   Methicillin resistant staphylococcus aureus - MIC*    CLINDAMYCIN <=0.25 SENSITIVE Sensitive     ERYTHROMYCIN >=8 RESISTANT Resistant     GENTAMICIN <=0.5 SENSITIVE Sensitive     LEVOFLOXACIN 0.25 SENSITIVE Sensitive     OXACILLIN >=4 RESISTANT Resistant     RIFAMPIN <=0.5 SENSITIVE Sensitive     TRIMETH/SULFA <=10 SENSITIVE Sensitive     VANCOMYCIN 1 SENSITIVE Sensitive     TETRACYCLINE <=1 SENSITIVE Sensitive     * ABUNDANT METHICILLIN RESISTANT STAPHYLOCOCCUS AUREUS    [x]  Treated with cephalexin, organism resistant to prescribed antimicrobial []  Patient discharged originally without antimicrobial agent and treatment is now indicated  New antibiotic prescription: Bactrim DS 1 tab BID for 10 days  ED Provider: Mayme GentaBen Cartner, PA  Arlean Hoppingorey  M. Newman PiesBall, PharmD, BCPS Clinical Pharmacist Pager [phone] 11/15/2015, 9:07 AM Infectious Diseases Pharmacist Phone# [phone]

## 2015-11-15 NOTE — Telephone Encounter (Signed)
Post ED Visit - Positive Culture Follow-up: Chart Hand-off to ED Flow Manager  Culture assessed and recommendations reviewed by: []  Isaac BlissMichael Maccia, Pharm.D., BCPS []  Celedonio MiyamotoJeremy Frens, Pharm.D., BCPS-AQ ID []  Georgina PillionElizabeth Martin, Pharm.D., BCPS []  [place]h Pham, [address] BoulevardPharm.D., BCPS, AAHIVP []  Estella HuskMichelle Turner, Pharm .D., BCPS, AAHIVP []  Tennis Mustassie Stewart, Pharm.D. []  corey ball pharm d Casilda Carlsaylor Stone, Pharm.D.  Positive wound culture  [x]  Patient discharged without antimicrobial prescription and treatment is now indicated []  Organism is resistant to prescribed ED discharge antimicrobial []  Patient with positive blood cultures  Changes discussed with ED provider: Mayme GentaBen Cartner PA New antibiotic prescription bactrim DS 1 tab po bid x 3 days  Number in epic invalid. Letter sent to address on file   Ashley JacobsFesterman, Horatio Bertz C 11/15/2015, 12:15 PM

## 2015-11-18 ENCOUNTER — Emergency Department (HOSPITAL_COMMUNITY)
Admission: EM | Admit: 2015-11-18 | Discharge: 2015-11-18 | Disposition: A | Discharge status: Home / Self Care | Payer: Self-pay | Attending: Emergency Medicine | Admitting: Emergency Medicine

## 2015-11-18 ENCOUNTER — Encounter (HOSPITAL_COMMUNITY): Payer: Self-pay | Admitting: Emergency Medicine

## 2015-11-18 DIAGNOSIS — Z5189 Encounter for other specified aftercare: Secondary | ICD-10-CM

## 2015-11-18 DIAGNOSIS — M79644 Pain in right finger(s): Secondary | ICD-10-CM | POA: Insufficient documentation

## 2015-11-18 DIAGNOSIS — F17210 Nicotine dependence, cigarettes, uncomplicated: Secondary | ICD-10-CM | POA: Insufficient documentation

## 2015-11-18 DIAGNOSIS — Z791 Long term (current) use of non-steroidal anti-inflammatories (NSAID): Secondary | ICD-10-CM | POA: Insufficient documentation

## 2015-11-18 DIAGNOSIS — Z4801 Encounter for change or removal of surgical wound dressing: Secondary | ICD-10-CM | POA: Insufficient documentation

## 2015-11-18 DIAGNOSIS — Z792 Long term (current) use of antibiotics: Secondary | ICD-10-CM | POA: Insufficient documentation

## 2015-11-18 NOTE — Discharge Instructions (Signed)
Continue taking your antibiotics as prescribed. Follow up with Dr. Melvyn Novas for your hand.   Wound Care    Taking care of your wound properly can help to prevent pain and infection. It can also help your wound to heal more quickly.  HOW TO CARE FOR YOUR WOUND  Take or apply over-the-counter and prescription medicines only as told by your health care provider.  If you were prescribed antibiotic medicine, take or apply it as told by your health care provider. Do not stop using the antibiotic even if your condition improves.  Clean the wound each day or as told by your health care provider.  Wash the wound with mild soap and water.  Rinse the wound with water to remove all soap.  Pat the wound dry with a clean towel. Do not rub it. There are many different ways to close and cover a wound. For example, a wound can be covered with stitches (sutures), skin glue, or adhesive strips. Follow instructions from your health care provider about:  How to take care of your wound.  When and how you should change your bandage (dressing).  When you should remove your dressing.  Removing whatever was used to close your wound. Check your wound every day for signs of infection. Watch for:  Redness, swelling, or pain.  Fluid, blood, or pus. Keep the dressing dry until your health care provider says it can be removed. Do not take baths, swim, use a hot tub, or do anything that would put your wound underwater until your health care provider approves.  Raise (elevate) the injured area above the level of your heart while you are sitting or lying down.  Do not scratch or pick at the wound.  Keep all follow-up visits as told by your health care provider. This is important. SEEK MEDICAL CARE IF:  You received a tetanus shot and you have swelling, severe pain, redness, or bleeding at the injection site.  You have a fever.  Your pain is not controlled with medicine.  You have increased redness, swelling, or pain at the  site of your wound.  You have fluid, blood, or pus coming from your wound.  You notice a bad smell coming from your wound or your dressing. SEEK IMMEDIATE MEDICAL CARE IF:  You have a red streak going away from your wound. This information is not intended to replace advice given to you by your health care provider. Make sure you discuss any questions you have with your health care provider.  Document Released: 07/26/2008 Document Revised: 03/03/2015 Document Reviewed: 10/13/2014  Elsevier Interactive Patient Education [employer]

## 2015-11-18 NOTE — ED Provider Notes (Signed)
CSN: [id number]     Arrival date & time 11/18/15  1328 History  By signing my name below, I, Essence Howell, attest that this documentation has been prepared under the direction and in the presence of Alveta Heimlich, PA-C Electronically Signed: Charline Bills, ED Scribe 11/18/2015 at 3:03 PM.   Chief Complaint  Patient presents with  . Hand Pain   The history is provided by the patient. No language interpreter was used.   HPI Comments: Jared Collins is a 35 y.o. male who presents to the Emergency Department for a follow-up regarding right index finger pain. Pt had an abscess to his right index finger lanced 1 week ago in the ED by Dr. Melvyn Novas. He is still experiencing mild pain to the area with bending. Pt states that he has taken antibiotics as prescribed. He denies drainage from the area. He has no concern for infection. He states that he wants it cleaned and would like more pain medication since he ran out. No other complaints reported today. He has not scheduled a follow-up appointment with Dr. Melvyn Novas yet. He was seen in ED 4 days ago for packing removal and adverse reaction to vicodin which included sweats, nausea and vomiting. He was prescribed percocet which he reports no adverse effects to. He was again instructed to follow up with Dr. Melvyn Novas which he has not done. Denies fevers, chills, dizziness, syncope, finger swelling, inability to move his finger, drainage, redness spreading from the wound, weakness of the hand or any other complaints. He states that his finger has been improving since last ED visit.    History reviewed. No pertinent past medical history. History reviewed. No pertinent past surgical history. No family history on file. Social History  Substance Use Topics  . Smoking status: Current Every Day Smoker -- 1.00 packs/day    Types: Cigarettes  . Smokeless tobacco: None  . Alcohol Use: No    Review of Systems  Skin: Positive for wound.  All other systems reviewed and  are negative.  Allergies  Vicodin  Home Medications   Prior to Admission medications   Medication Sig Start Date End Date Taking? Authorizing Provider  cephALEXin (KEFLEX) 500 MG capsule Take 1 capsule (500 mg total) by mouth 4 (four) times daily. 11/11/15   Eyvonne Mechanic, PA-C  cyclobenzaprine (FLEXERIL) 10 MG tablet Take 0.5-1 tablets (5-10 mg total) by mouth 2 (two) times daily as needed. Patient not taking: Reported on 11/14/2015 07/20/15   Marlon Pel, PA-C  diphenhydrAMINE (BENADRYL) 25 mg capsule Take 1 capsule (25 mg total) by mouth every 6 (six) hours as needed. Patient taking differently: Take 25 mg by mouth every 6 (six) hours as needed for allergies.  11/11/15   Cheri Fowler, PA-C  doxycycline (VIBRAMYCIN) 100 MG capsule Take 1 capsule (100 mg total) by mouth 2 (two) times daily. 11/11/15   Cheri Fowler, PA-C  HYDROcodone-acetaminophen (NORCO/VICODIN) 5-325 MG tablet Take 2 tablets by mouth every 4 (four) hours as needed. 11/11/15   Eyvonne Mechanic, PA-C  ibuprofen (ADVIL,MOTRIN) 800 MG tablet Take 1 tablet (800 mg total) by mouth 3 (three) times daily. 11/11/15   Cheri Fowler, PA-C  oxyCODONE-acetaminophen (PERCOCET/ROXICET) 5-325 MG tablet Take 1 tablet by mouth every 4 (four) hours as needed for severe pain. 11/14/15   Mady Gemma, PA-C   BP 102/74 mmHg  Pulse 77  Temp(Src) 97.8 F (36.6 C) (Oral)  Resp 16  Ht  (1.803 m)  Wt 170 lb (77.111 kg)  BMI 23.72  kg/m2  SpO2 97% Physical Exam  Constitutional: He appears well-developed and well-nourished. No distress.  HENT:  Head: Normocephalic and atraumatic.  Right Ear: External ear normal.  Left Ear: External ear normal.  Eyes: Conjunctivae are normal. Right eye exhibits no discharge. Left eye exhibits no discharge. No scleral icterus.  Neck: Normal range of motion.  Cardiovascular: Normal rate and intact distal pulses.   Radial pulse palpable. Cap refill < 3 seconds  Pulmonary/Chest: Effort normal.   Musculoskeletal: Normal range of motion. He exhibits tenderness. He exhibits no edema.  Tenderness of skin immediately surrounding wound. Some restricted movement of the index finger when attempting to make a fist due to pain. FROM of the remaining digits and wrist. No edema of the finger.   Neurological: He is alert. Coordination normal.  5/5 grip strength with sensation fully intact.   Skin: Skin is warm and dry. There is erythema.  3 cm incision to dorsal aspect of right index finger with appropriate wound healing. Faint surrounding erythema without streaking. No purulent or serous drainage. No fluctuance or induration.  Psychiatric: He has a normal mood and affect. His behavior is normal.  Nursing note and vitals reviewed.  ED Course  Procedures (including critical care time) DIAGNOSTIC STUDIES: Oxygen Saturation is 97% on RA, normal by my interpretation.    COORDINATION OF CARE: 2:44 PM-Discussed treatment plan which includes f/u with Dr. Melvyn Novas with pt at bedside and pt agreed to plan.   Labs Review Labs Reviewed - No data to display  Imaging Review No results found.   EKG Interpretation None      MDM   Final diagnoses:  Visit for wound check   35 year old male presenting for a wound check. Patient had an abscess right index finger drained 1 week ago. He was instructed to follow up with hand surgery but failed to do so. He presents to the emergency department 4 days ago for wound check and was again instructed to follow-up with hand surgery. He still has not scheduled a follow-up appointment. He states he is here today for a wound cleaning and for more pain medication. He has no concerns for infection. Vital signs stable. Well-healing incision to the right index finger without drainage. No signs of infection. Right hand is neurovascularly intact. Discussed the patient needs to follow up with hand surgery for further wound checks and pain management. Patient states  understanding and will call Dr. Glenna Durand office today. Return precautions given in discharge paperwork and discussed with pt at bedside. Pt stable for discharge  I personally performed the services described in this documentation, which was scribed in my presence. The recorded information has been reviewed and is accurate.   Rolm Gala Allaina Brotzman, PA-C 11/18/15 1548  Blane Ohara, MD 11/18/15 [phone]

## 2015-11-18 NOTE — ED Notes (Addendum)
Pt states 5 days ago he had a surgery on his right hand. Pt states he doesn't know why he had surgery. Right index finger has partially healed wound on top of finger. No bleeding or drainage is noted. Pt just states he came to have it looked at and cleaned. Pt also states out of pain meds.

## 2015-11-28 ENCOUNTER — Telehealth (HOSPITAL_COMMUNITY): Payer: Self-pay

## 2015-11-28 NOTE — Telephone Encounter (Signed)
Unable to contact pt by mail or telephone. Unable to communicate lab results or treatment changes. 

## 2016-07-01 IMAGING — DX DG HAND COMPLETE 3+V*R*
3 series · 3 of 3 positions shown · non-contrast
Comparison: July 20, 2015.

CLINICAL DATA: Right hand pain and swelling without known injury.

EXAM:
RIGHT HAND - COMPLETE 3+ VIEW

[hand pa]
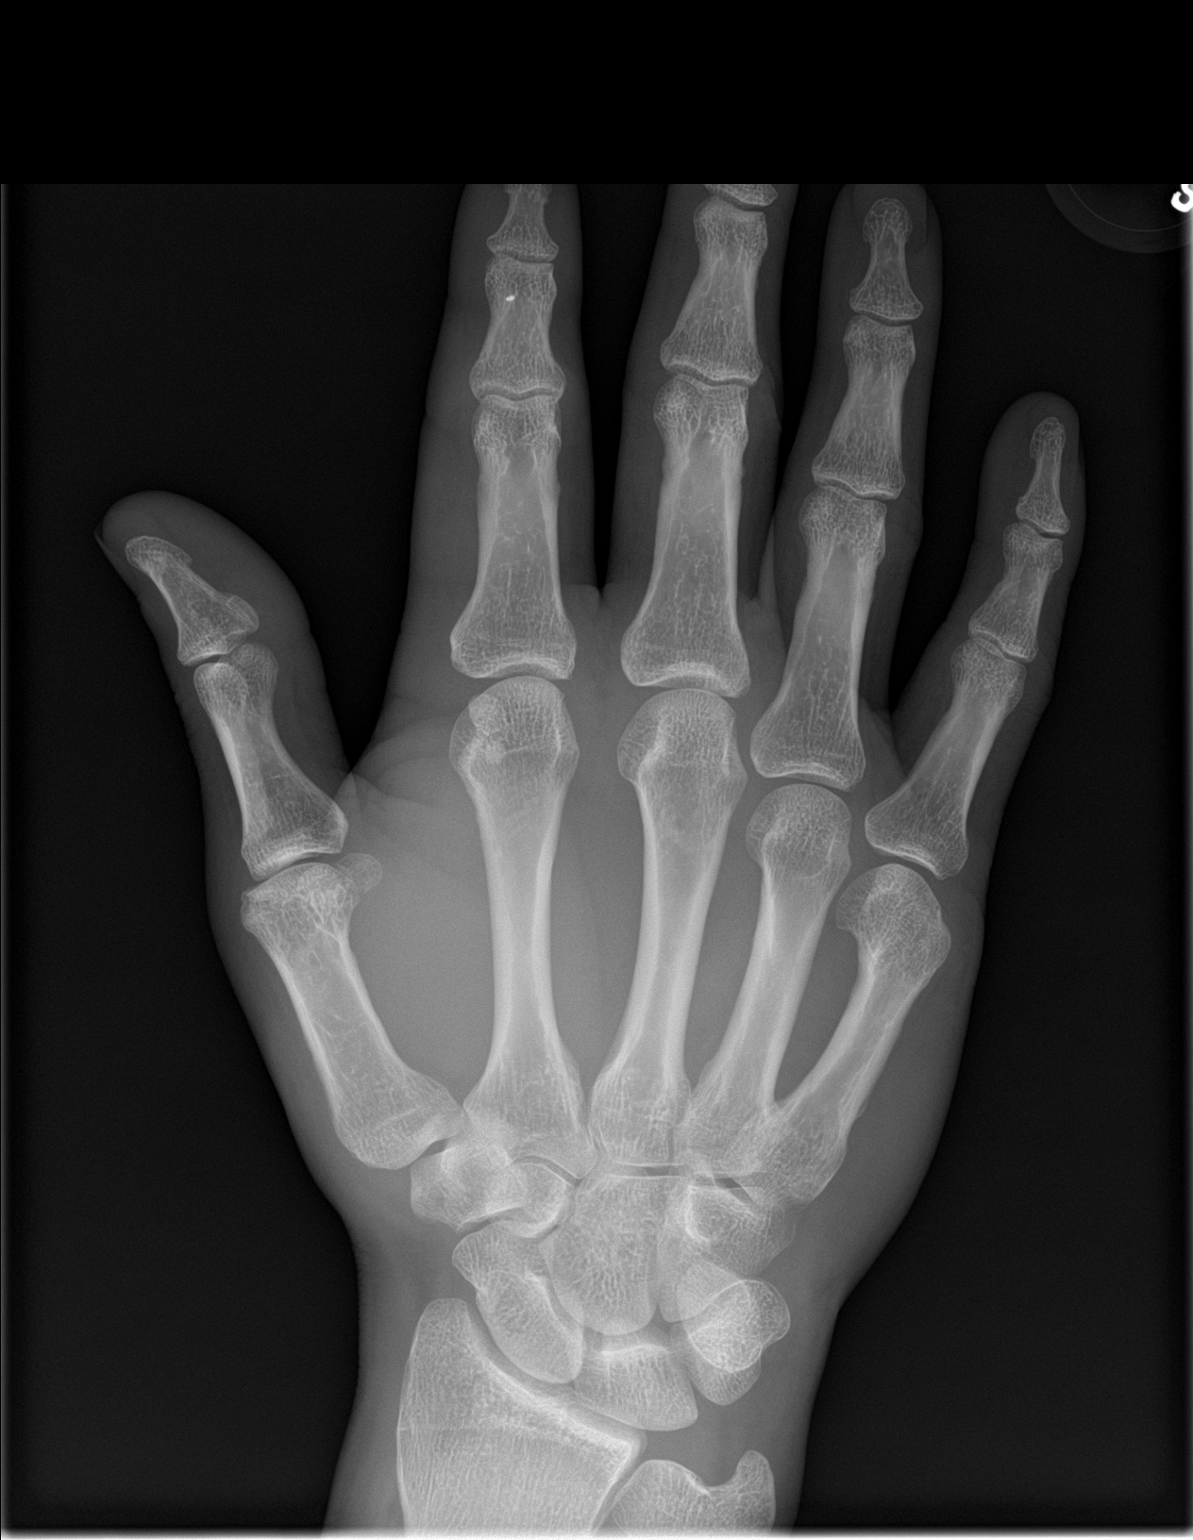

[hand obl]
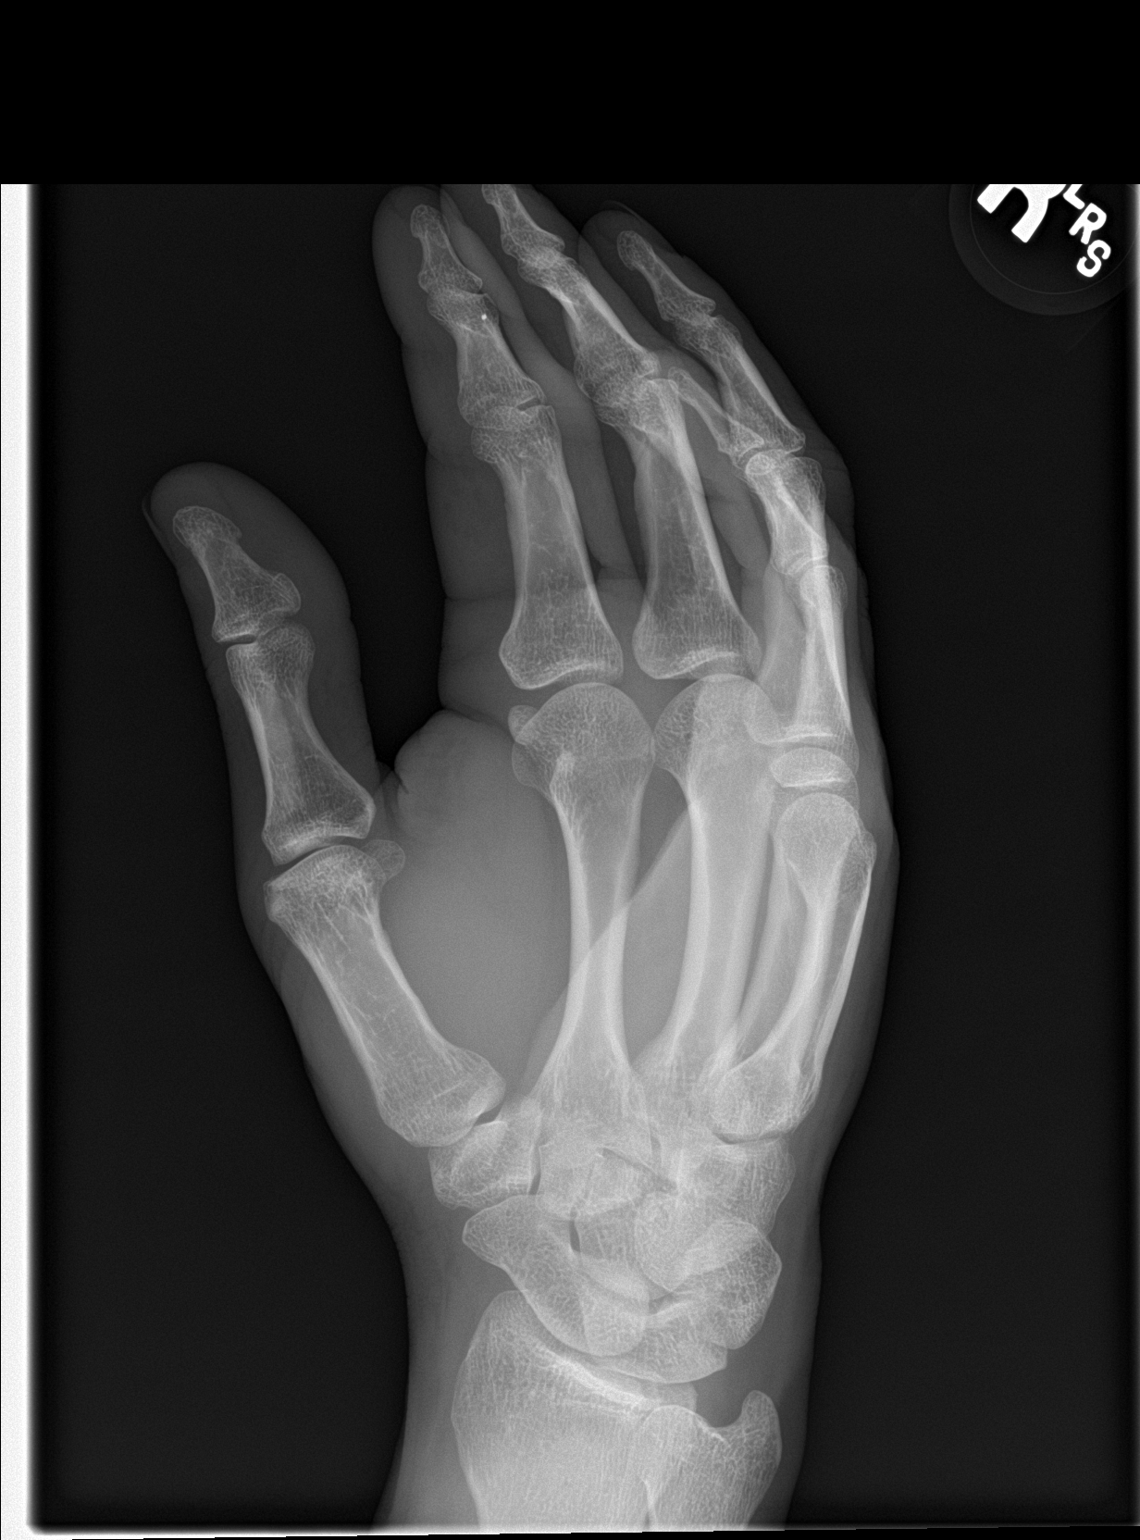

[hand lat]
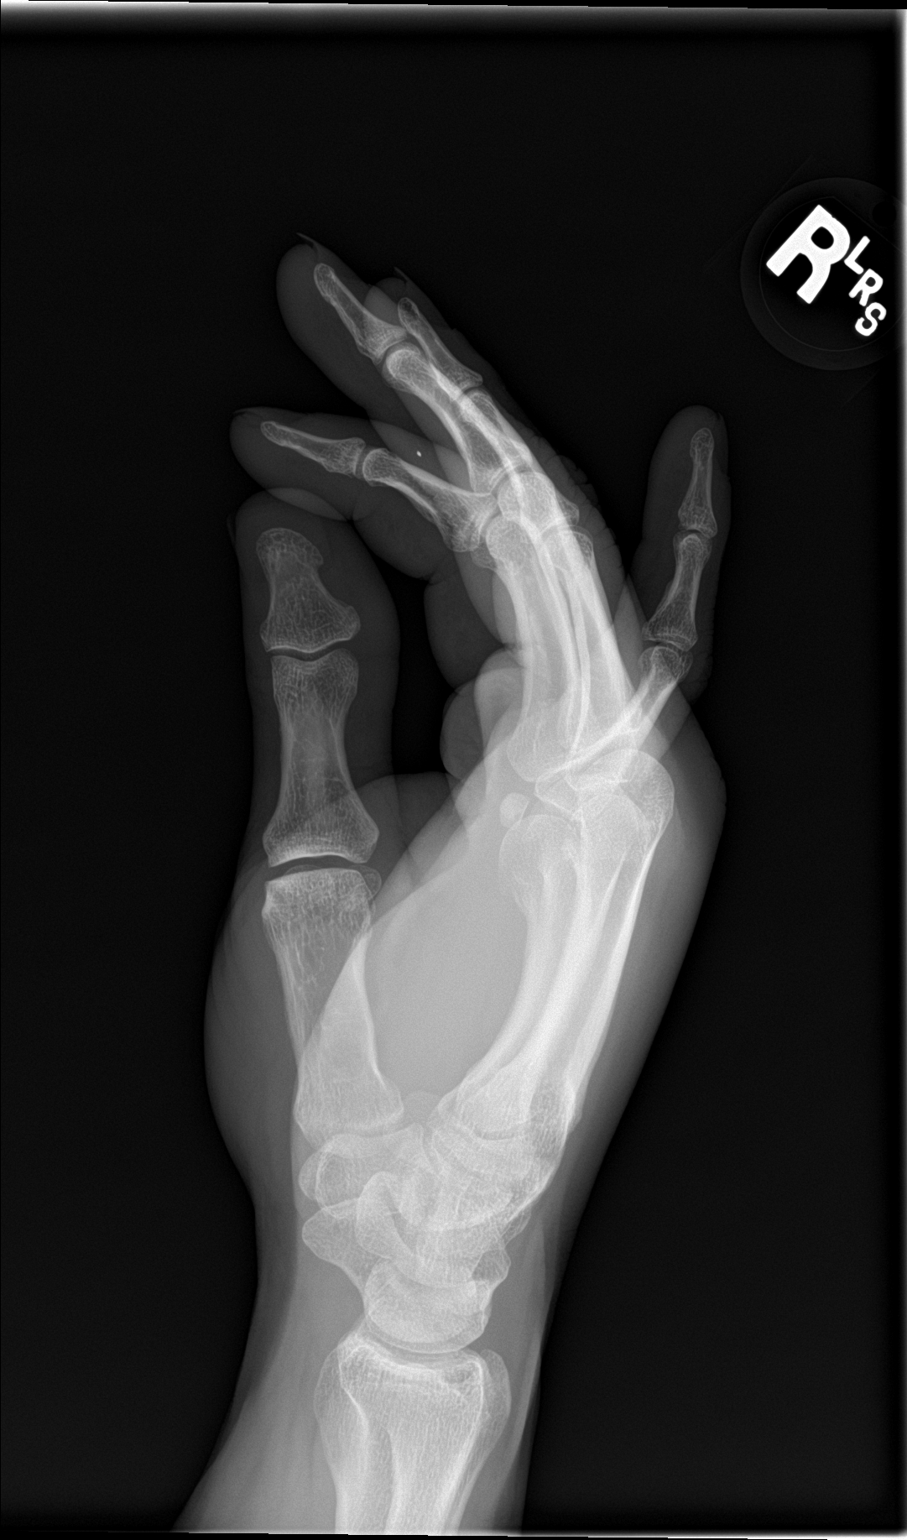

[3 of 3 positions shown; findings below may reference images not displayed]

FINDINGS: There is no evidence of acute fracture or dislocation. Joint spaces
are intact. Metallic soft tissue density is seen in soft tissues
dorsal to second middle phalanx.
IMPRESSION: No fracture or dislocation is noted. Metallic foreign body is noted
in soft tissues dorsal to second middle phalanx.
